# Patient Record
Sex: Female | Born: 1990 | Hispanic: Yes | Marital: Married | Smoking: Never smoker
Health system: Southern US, Community
[De-identification: ages and names within clinical notes are randomized; demographics above are authoritative.]

---
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
## 2013-11-02 ENCOUNTER — Encounter: Payer: Self-pay | Admitting: Obstetrics & Gynecology

## 2013-12-07 ENCOUNTER — Encounter: Payer: Self-pay | Admitting: Maternal & Fetal Medicine

## 2013-12-13 ENCOUNTER — Encounter: Payer: Self-pay | Admitting: Maternal & Fetal Medicine

## 2014-04-06 ENCOUNTER — Emergency Department: Payer: Self-pay | Admitting: Emergency Medicine

## 2015-02-20 IMAGING — US US OB NUCHAL TRANSLUCENCY 1ST GEST - [facility] NRPT
2 series · 14 of 28 positions shown · non-contrast
Comparison: none

[Series 1: us ob nuchal translucency 1st gest - [facility] nr[facility] · 0.18mm/px · 13 of 49 slices shown (1 of 2)]
[im 2/49]
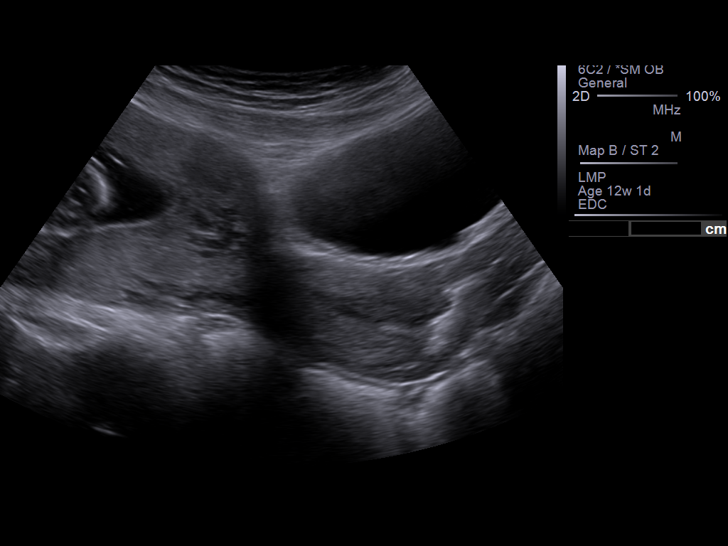
[im 6/49]
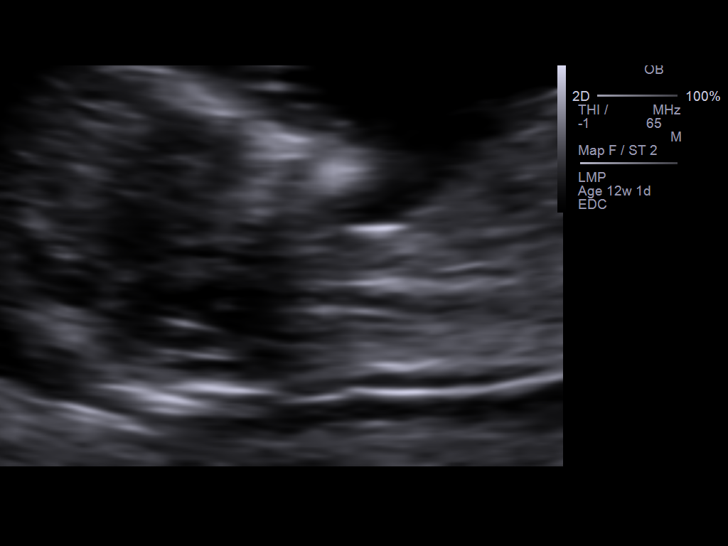
[im 10/49]
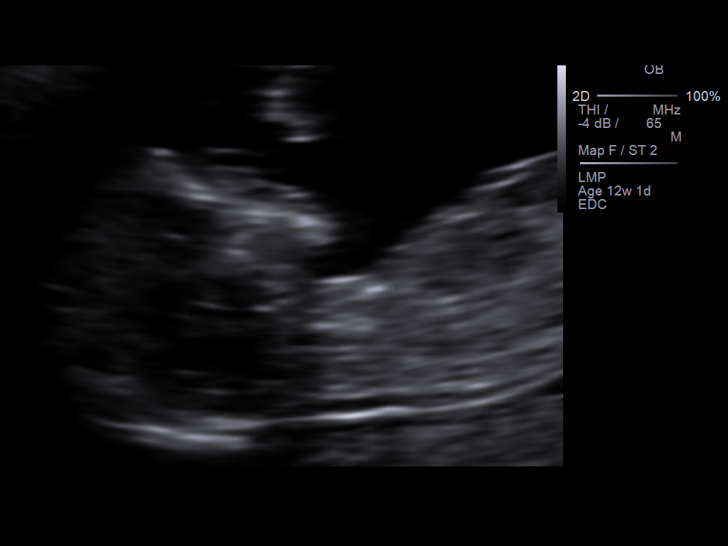
[im 14/49]
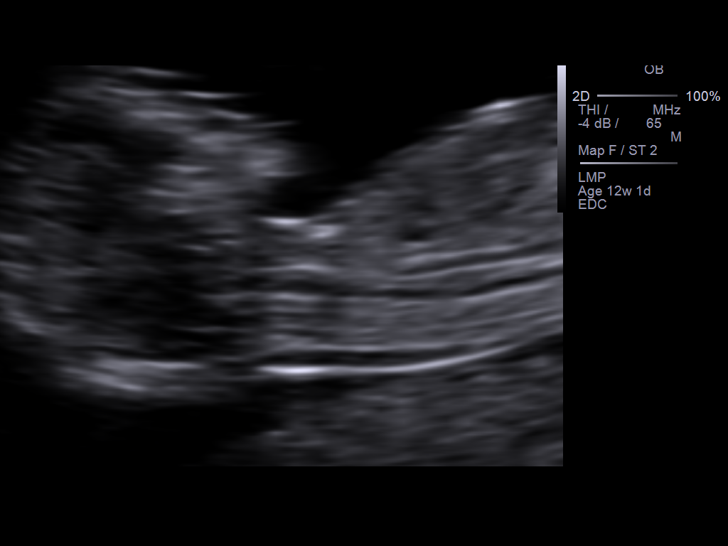
[im 18/49]
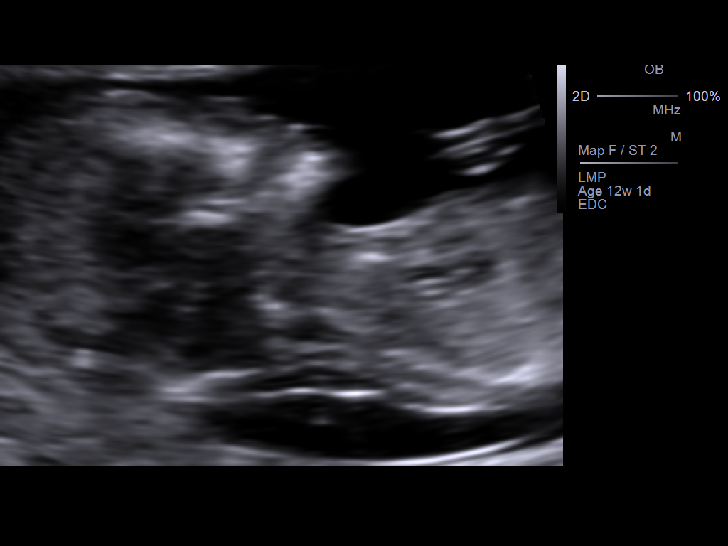
[im 22/49]
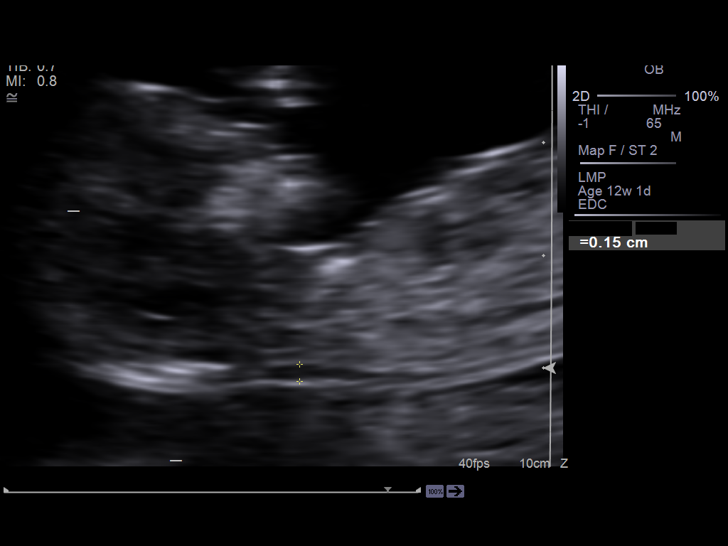
[im 25/49]
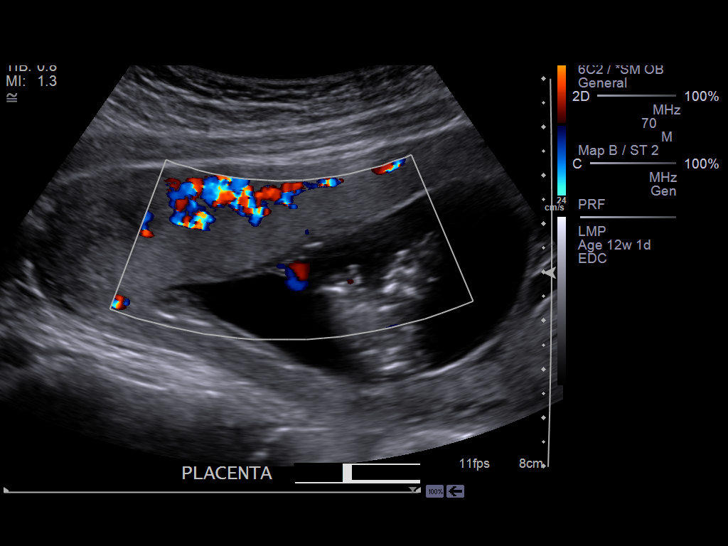
[im 29/49]
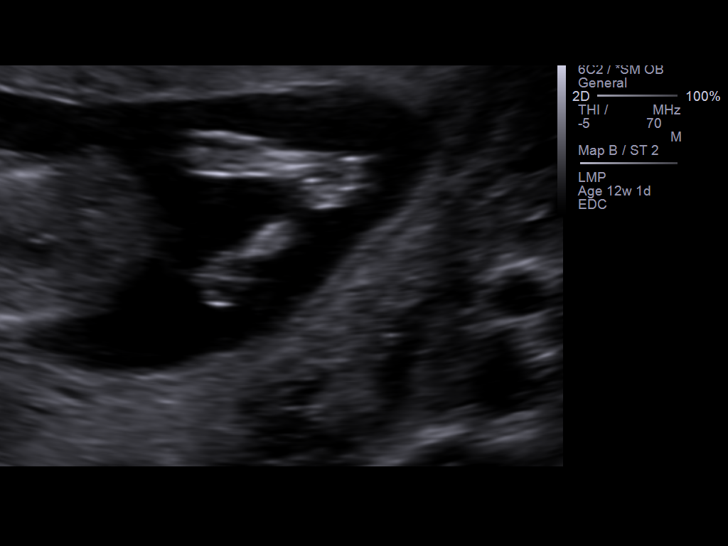
[im 33/49]
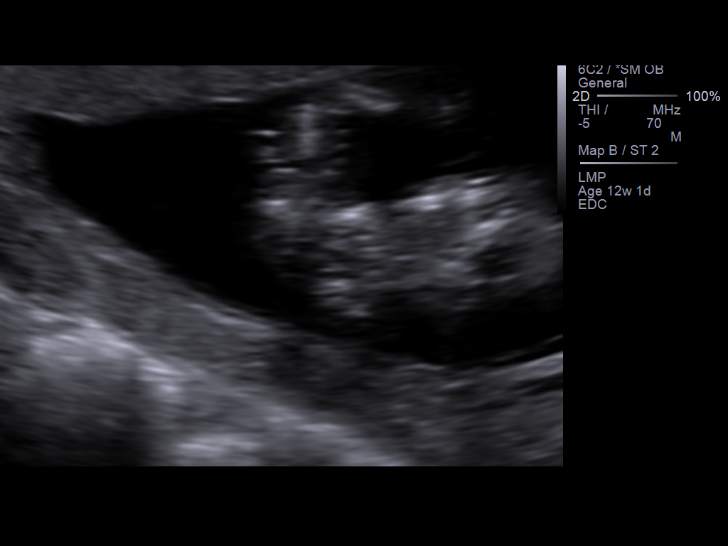
[im 37/49]
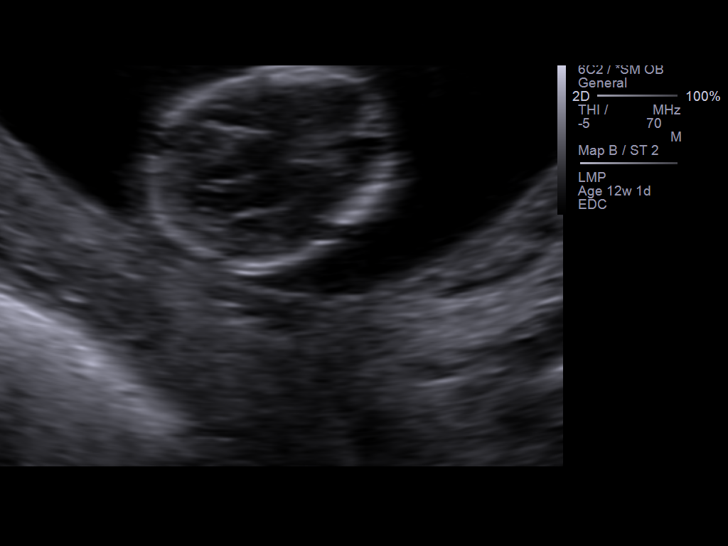
[im 41/49]
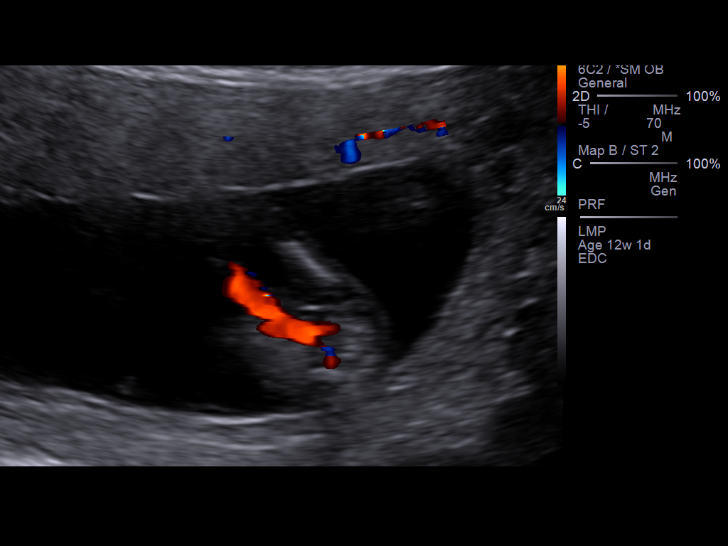
[im 45/49]
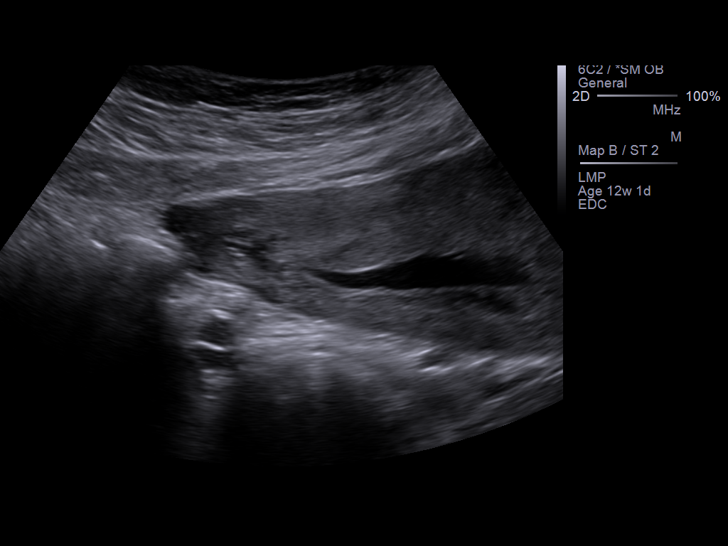
[im 49/49]
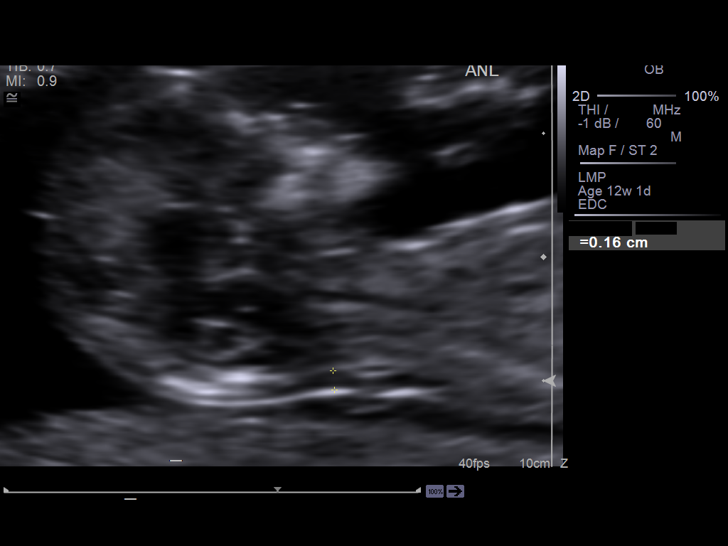

[Series 1001: us ob nuchal translucency 1st gest - [facility] · 0.06mm/px · 1 of 3 slices shown (2 of 2)]
[im 3/3]
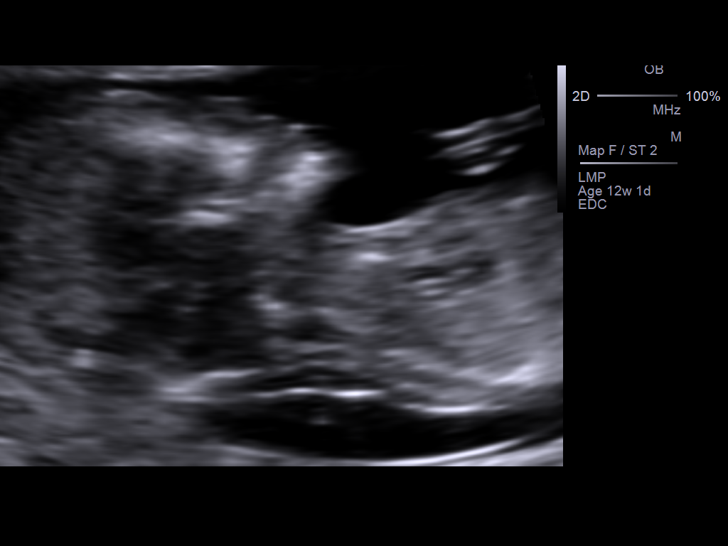

[14 of 28 positions shown; findings below may reference images not displayed]

IMAGES IMPORTED FROM THE [employer] WORKFLOW SYSTEM
NO DICTATION FOR STUDY

## 2017-12-14 ENCOUNTER — Emergency Department
Admission: EM | Admit: 2017-12-14 | Discharge: 2017-12-14 | Disposition: A | Discharge status: Home / Self Care | Payer: Self-pay | Attending: Emergency Medicine | Admitting: Emergency Medicine

## 2017-12-14 ENCOUNTER — Other Ambulatory Visit: Payer: Self-pay

## 2017-12-14 ENCOUNTER — Emergency Department: Payer: Self-pay

## 2017-12-14 ENCOUNTER — Encounter: Payer: Self-pay | Admitting: Emergency Medicine

## 2017-12-14 DIAGNOSIS — O200 Threatened abortion: Secondary | ICD-10-CM | POA: Insufficient documentation

## 2017-12-14 DIAGNOSIS — O4690 Antepartum hemorrhage, unspecified, unspecified trimester: Secondary | ICD-10-CM

## 2017-12-14 DIAGNOSIS — Z3A01 Less than 8 weeks gestation of pregnancy: Secondary | ICD-10-CM | POA: Insufficient documentation

## 2017-12-14 DIAGNOSIS — R102 Pelvic and perineal pain: Secondary | ICD-10-CM | POA: Insufficient documentation

## 2017-12-14 LAB — ABO/RH: ABO/RH(D): B POS

## 2017-12-14 LAB — HCG, QUANTITATIVE, PREGNANCY: HCG, BETA CHAIN, QUANT, S: 47 m[IU]/mL — AB (ref <5)

## 2017-12-14 NOTE — ED Provider Notes (Signed)
[facility] Emergency Department Provider Note   ____________________________________________    I have reviewed the triage vital signs and the nursing notes.   HISTORY  Chief Complaint Vaginal Bleeding     HPI Veronica Cabrera is a 27 y.o. female who presents with complaints of vaginal bleeding.  Patient reports she is approximately [redacted] weeks pregnant.  G3 P2.  No problems with her first pregnancies, vaginal deliveries.  She complains of lower abdominal cramping and bleeding which is mild to moderate which started today around noon.  No fevers or chills.  No dysuria.  Has not taken any new medications.   History reviewed. No pertinent past medical history.  There are no active problems to display for this patient.   History reviewed. No pertinent surgical history.  Prior to Admission medications   Not on File     Allergies Patient has no known allergies.  History reviewed. No pertinent family history.  Social History Social History   Tobacco Use  . Smoking status: Never Smoker  . Smokeless tobacco: Never Used  Substance Use Topics  . Alcohol use: No    Frequency: Never  . Drug use: No    Review of Systems  Constitutional: No fevers Eyes: No visual changes.  ENT: No sore throat. Cardiovascular: Denies chest pain. Respiratory: No cough Gastrointestinal:  No nausea, no vomiting.   Genitourinary: Vaginal bleeding and pelvic cramping as above Musculoskeletal: Negative for back pain. Skin: Negative for rash. Neurological: Negative for headaches    ____________________________________________   PHYSICAL EXAM:  VITAL SIGNS: ED Triage Vitals [12/14/17 1643]  Enc Vitals Group     BP (!) 151/87     Pulse Rate 90     Resp 16     Temp 99.1 F (37.3 C)     Temp Source Oral     SpO2 100 %     Weight 64.4 kg (142 lb)     Height 1.524 m (5')     Head Circumference      Peak Flow      Pain Score 6     Pain Loc    Pain Edu?      Excl. in GC?     Constitutional: Alert and oriented. No acute distress.  Eyes: Conjunctivae are normal.   Nose: No congestion/rhinnorhea. Mouth/Throat: Mucous membranes are moist.    Cardiovascular: Normal rate, regular rhythm. Grossly normal heart sounds.  Good peripheral circulation. Respiratory: Normal respiratory effort.  No retractions. Lungs CTAB. Gastrointestinal: Soft and nontender. No distention.  No CVA tenderness. Genitourinary: deferred Musculoskeletal:  Warm and well perfused Neurologic:  Normal speech and language. No gross focal neurologic deficits are appreciated.  Skin:  Skin is warm, dry and intact. No rash noted. Psychiatric: Mood and affect are normal. Speech and behavior are normal.  ____________________________________________   LABS (all labs ordered are listed, but only abnormal results are displayed)  Labs Reviewed  HCG, QUANTITATIVE, PREGNANCY - Abnormal; Notable for the following components:      Result Value   hCG, Beta Chain, Quant, S 47 (*)    All other components within normal limits  ABO/RH   ____________________________________________  EKG  None ____________________________________________  RADIOLOGY  Ultrasound does not show an IUP ____________________________________________   PROCEDURES  Procedure(s) performed: No  Procedures   Critical Care performed: No ____________________________________________   INITIAL IMPRESSION / ASSESSMENT AND PLAN / ED COURSE  Pertinent labs & imaging results that were available during my care of  the patient were reviewed by me and considered in my medical decision making (see chart for details).  Beta-hCG is only 47, ultrasound does not show an IUP.  This could be because of early pregnancy, ectopic, miscarriage discussed these options with the patient and the need for close follow-up and hCG recheck.  Patient agrees with this plan.      ____________________________________________   FINAL CLINICAL IMPRESSION(S) / ED DIAGNOSES  Final diagnoses:  Vaginal bleeding in pregnancy  Threatened miscarriage        Note:  This document was prepared using Dragon voice recognition software and may include unintentional dictation errors.    Jene EveryKinner, Cheryll Keisler, MD 12/14/17 2308

## 2017-12-14 NOTE — ED Triage Notes (Signed)
Here for vaginal bleeding/spotting that started today. [redacted] weeks pregnant, confirmed at [facility].  G3P2. VSS. Ambulatory. NAD

## 2017-12-14 NOTE — Discharge Instructions (Signed)
You need a repeat hormone blood level as we discussed

## 2019-05-18 IMAGING — US US OB TRANSVAGINAL
1 series · 14 of 28 positions shown · non-contrast
Comparison: None.

CLINICAL DATA: Vaginal bleeding started today. Quantitative beta
HCG is 47. By LMP patient is 6 weeks 3 days. EDC by LMP is
08/06/2018.

EXAM:
OBSTETRIC <14 WK US AND TRANSVAGINAL OB US
TECHNIQUE: Both transabdominal and transvaginal ultrasound examinations were
performed for complete evaluation of the gestation as well as the
maternal uterus, adnexal regions, and pelvic cul-de-sac.
Transvaginal technique was performed to assess early pregnancy.

[Series 1: us ob transvaginal · 0.19mm/px · 14 of 107 slices shown]
[im 4/107]
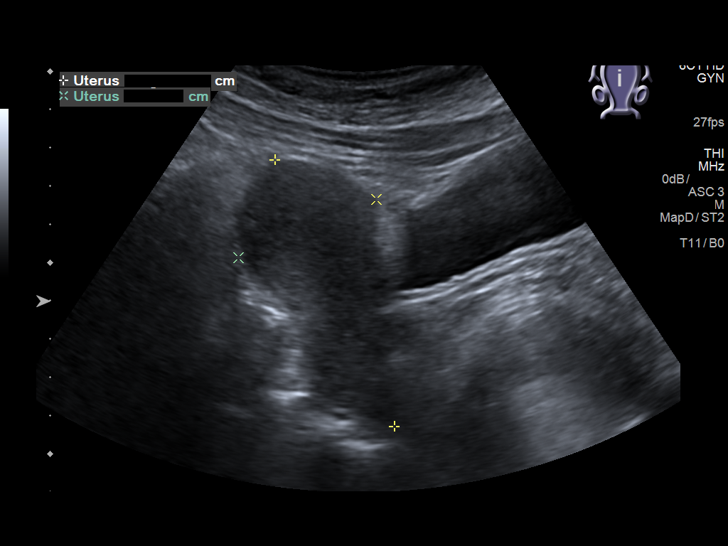
[im 12/107]
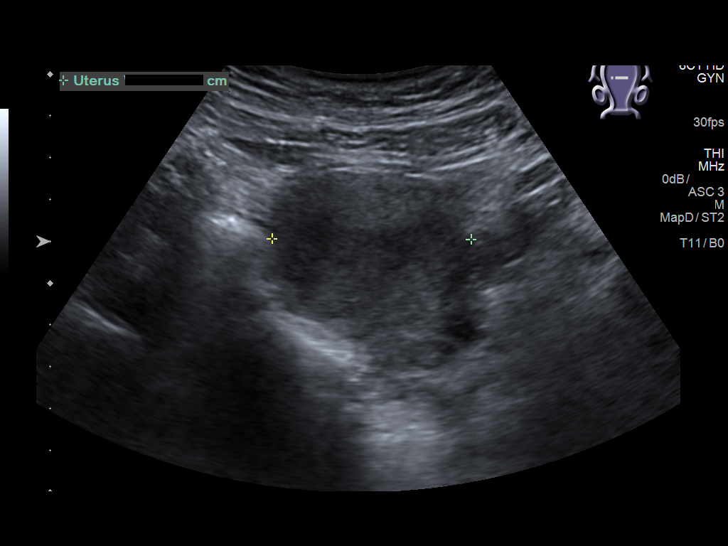
[im 20/107]
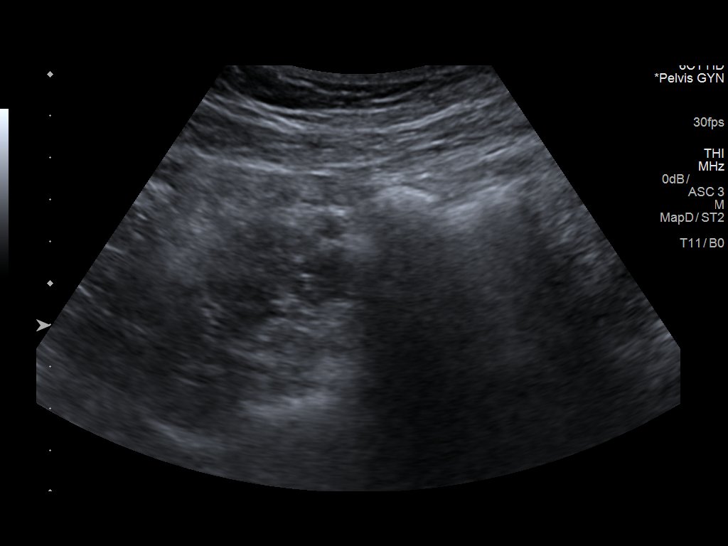
[im 28/107]
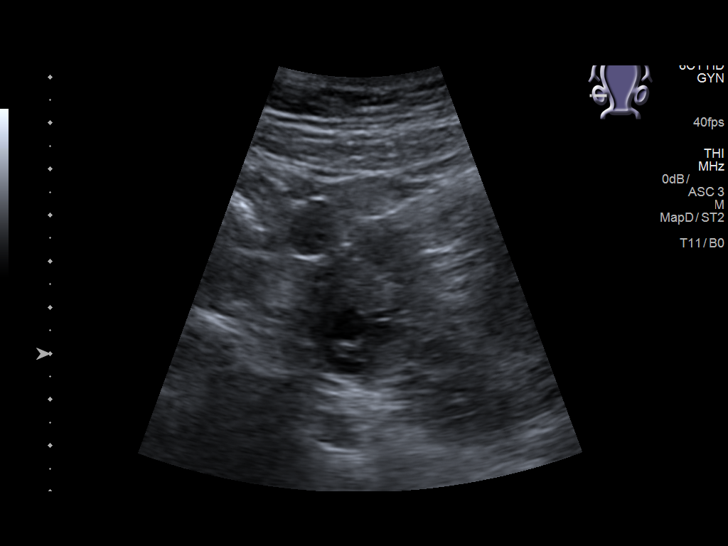
[im 36/107]
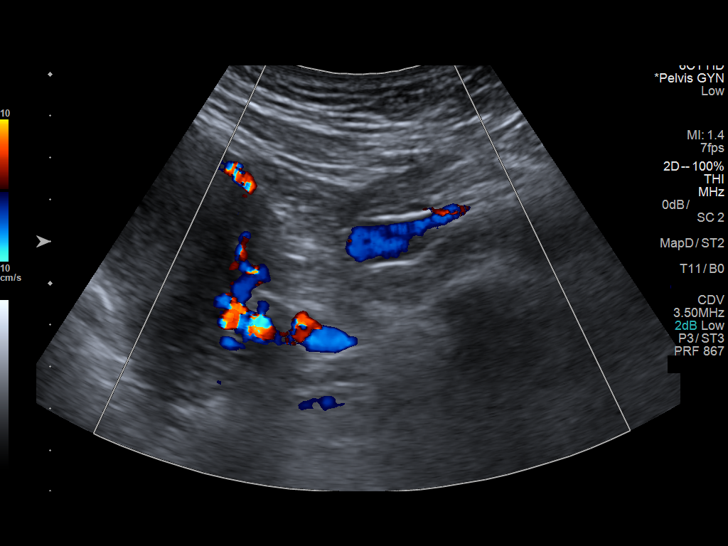
[im 44/107]
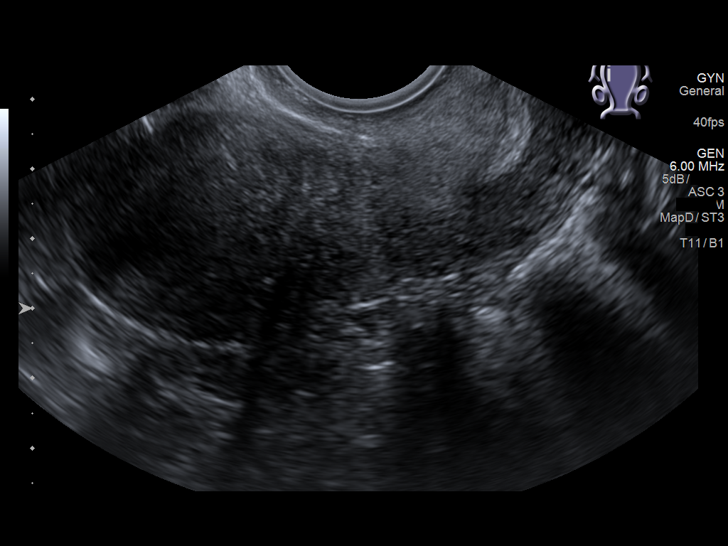
[im 52/107]
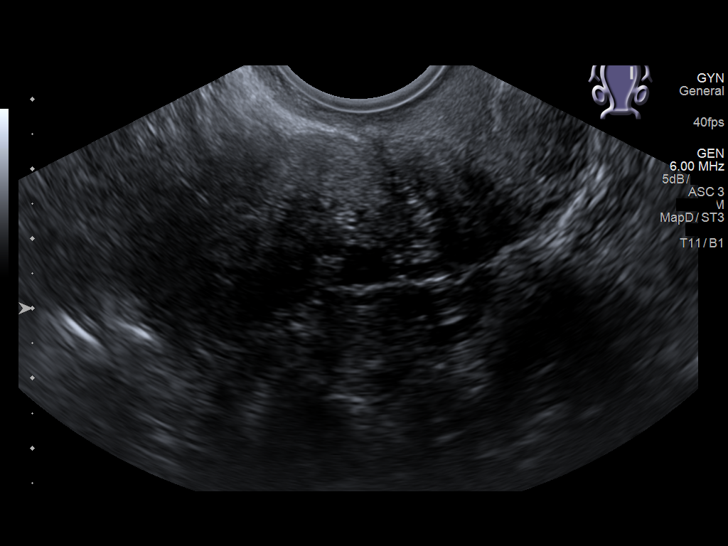
[im 59/107]
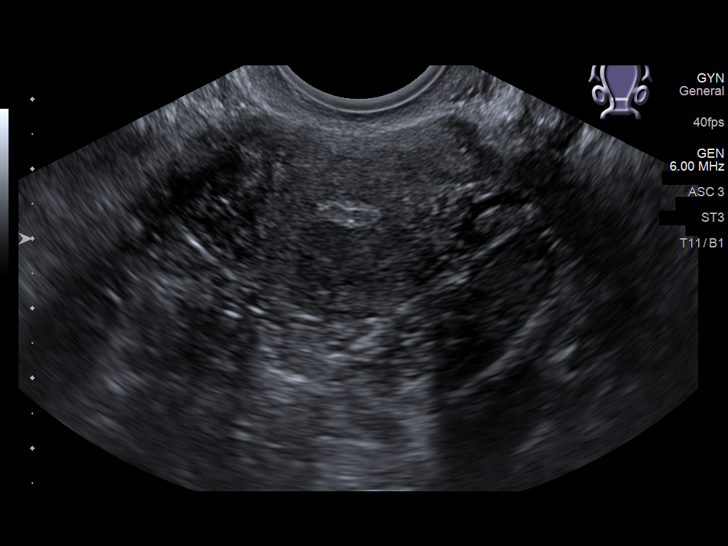
[im 67/107]
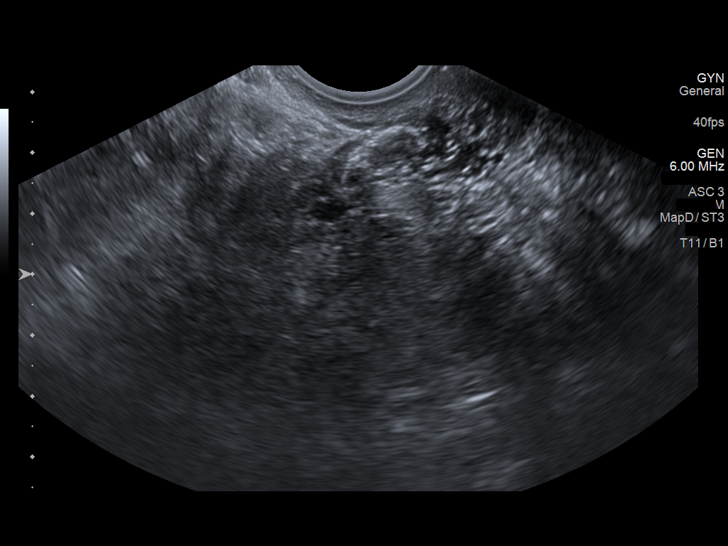
[im 75/107]
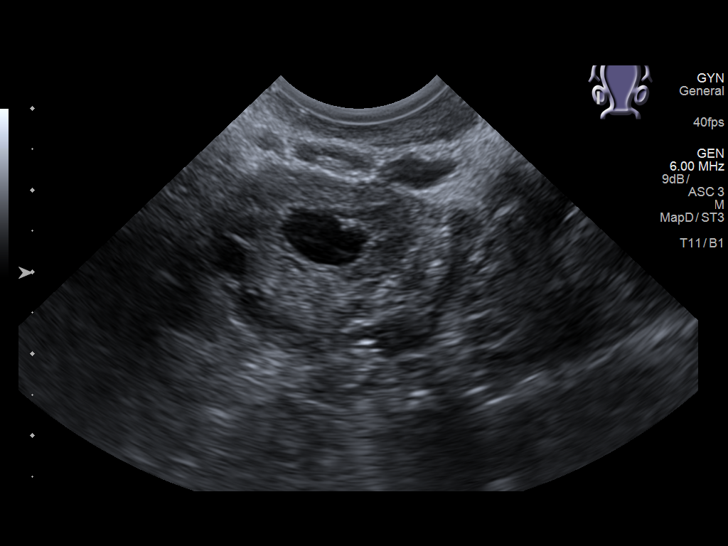
[im 83/107]
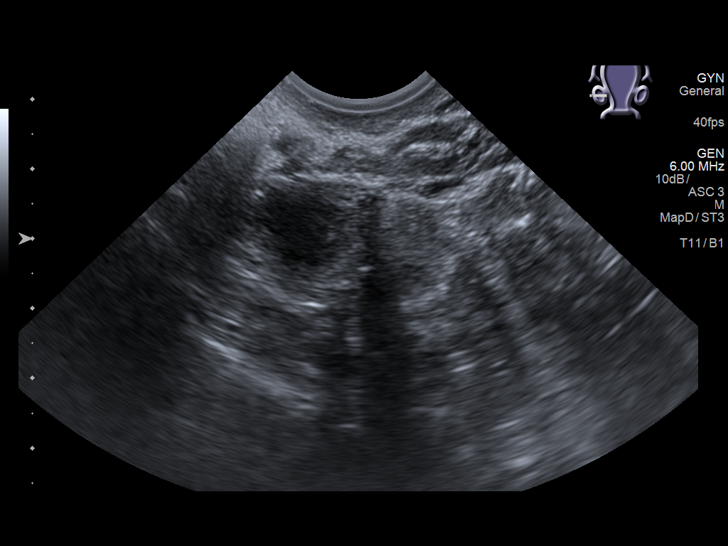
[im 91/107]
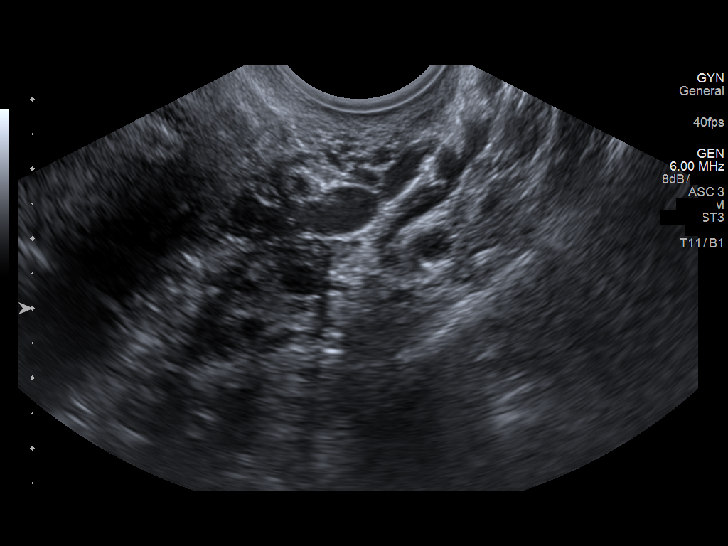
[im 99/107]
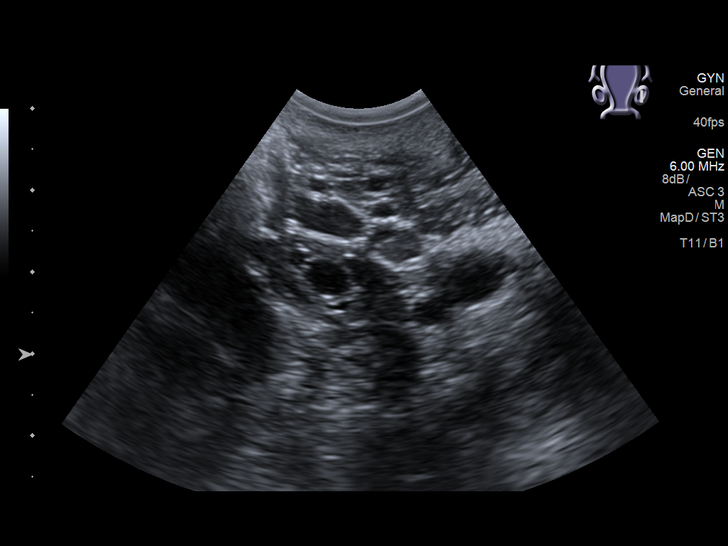
[im 107/107]
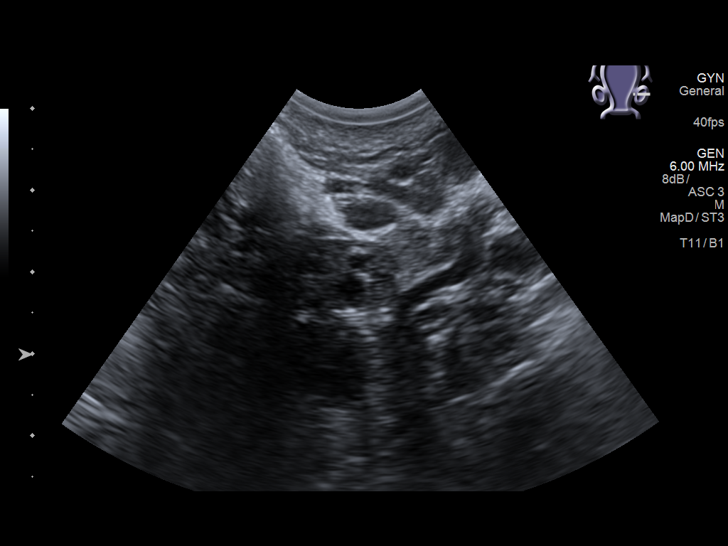

[14 of 28 positions shown; findings below may reference images not displayed]

FINDINGS: Intrauterine gestational sac: None

Yolk sac:  Not Visualized.

Embryo:  Not Visualized.

Cardiac Activity: Not Visualized.

Subchorionic hemorrhage:  No subchorionic hemorrhage.

Maternal uterus/adnexae: Ovaries are normal in appearance. No free
pelvic fluid.
IMPRESSION: 1.  Pregnancy of unknown location.
Considerations include completed spontaneous abortion, early
intrauterine pregnancy or early ectopic pregnancy.
Serial quantitative beta HCG values and follow-up ultrasound are
recommended as appropriate to document progression of and location
of pregnancy. Ectopic pregnancy has not been excluded.
2. Normal appearance of both ovaries.  No adnexal mass.
